# Patient Record
Sex: Male | Born: 1950 | Race: White | Hispanic: No | Marital: Single | Smoking: Never smoker
Health system: Southern US, Community
[De-identification: ages and names within clinical notes are randomized; demographics above are authoritative.]

## PROBLEM LIST (undated history)

## (undated) DIAGNOSIS — I10 Essential (primary) hypertension: Secondary | ICD-10-CM

## (undated) DIAGNOSIS — E785 Hyperlipidemia, unspecified: Secondary | ICD-10-CM

## (undated) DIAGNOSIS — E119 Type 2 diabetes mellitus without complications: Secondary | ICD-10-CM

## (undated) DIAGNOSIS — R000 Tachycardia, unspecified: Secondary | ICD-10-CM

## (undated) HISTORY — DX: Type 2 diabetes mellitus without complications: E11.9

## (undated) HISTORY — DX: Hyperlipidemia, unspecified: E78.5

## (undated) HISTORY — DX: Tachycardia, unspecified: R00.0

## (undated) HISTORY — DX: Essential (primary) hypertension: I10

---
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
## 2011-09-02 ENCOUNTER — Ambulatory Visit (INDEPENDENT_AMBULATORY_CARE_PROVIDER_SITE_OTHER): Payer: BC Managed Care – PPO

## 2011-09-02 DIAGNOSIS — Z79899 Other long term (current) drug therapy: Secondary | ICD-10-CM

## 2011-09-02 DIAGNOSIS — I10 Essential (primary) hypertension: Secondary | ICD-10-CM

## 2011-09-02 DIAGNOSIS — Z23 Encounter for immunization: Secondary | ICD-10-CM

## 2011-09-02 DIAGNOSIS — E119 Type 2 diabetes mellitus without complications: Secondary | ICD-10-CM

## 2011-09-02 DIAGNOSIS — Z139 Encounter for screening, unspecified: Secondary | ICD-10-CM

## 2011-11-09 ENCOUNTER — Other Ambulatory Visit: Payer: Self-pay | Admitting: Family Medicine

## 2011-12-08 ENCOUNTER — Ambulatory Visit (INDEPENDENT_AMBULATORY_CARE_PROVIDER_SITE_OTHER): Payer: BC Managed Care – PPO | Admitting: Family Medicine

## 2011-12-08 ENCOUNTER — Encounter: Payer: Self-pay | Admitting: Family Medicine

## 2011-12-08 VITALS — BP 138/82 | HR 123 | Temp 98.6°F | Resp 18 | Ht 68.5 in | Wt 228.0 lb

## 2011-12-08 DIAGNOSIS — L920 Granuloma annulare: Secondary | ICD-10-CM

## 2011-12-08 DIAGNOSIS — Z23 Encounter for immunization: Secondary | ICD-10-CM

## 2011-12-08 DIAGNOSIS — E119 Type 2 diabetes mellitus without complications: Secondary | ICD-10-CM

## 2011-12-08 DIAGNOSIS — E785 Hyperlipidemia, unspecified: Secondary | ICD-10-CM

## 2011-12-08 DIAGNOSIS — Z0000 Encounter for general adult medical examination without abnormal findings: Secondary | ICD-10-CM

## 2011-12-08 DIAGNOSIS — I10 Essential (primary) hypertension: Secondary | ICD-10-CM

## 2011-12-08 LAB — COMPREHENSIVE METABOLIC PANEL
Albumin: 4.4 g/dL (ref 3.5–5.2)
BUN: 18 mg/dL (ref 6–23)
CO2: 26 mEq/L (ref 19–32)
Calcium: 9 mg/dL (ref 8.4–10.5)
Chloride: 97 mEq/L (ref 96–112)
Creat: 1.06 mg/dL (ref 0.50–1.35)
Glucose, Bld: 108 mg/dL — ABNORMAL HIGH (ref 70–99)
Potassium: 4.4 mEq/L (ref 3.5–5.3)

## 2011-12-08 LAB — LIPID PANEL
Cholesterol: 163 mg/dL (ref 0–200)
HDL: 43 mg/dL (ref >39)
Triglycerides: 113 mg/dL (ref <150)

## 2011-12-08 LAB — GLUCOSE, POCT (MANUAL RESULT ENTRY): POC Glucose: 104

## 2011-12-08 MED ORDER — LISINOPRIL-HYDROCHLOROTHIAZIDE 20-25 MG PO TABS: 1.0000 | ORAL_TABLET | Freq: Every day | ORAL | Status: DC

## 2011-12-08 MED ORDER — TRIAMCINOLONE ACETONIDE 0.1 % EX CREA: TOPICAL_CREAM | Freq: Two times a day (BID) | CUTANEOUS | Status: AC

## 2011-12-08 MED ORDER — METFORMIN HCL 500 MG PO TABS: 500.0000 mg | ORAL_TABLET | Freq: Every day | ORAL | Status: DC

## 2011-12-08 MED ORDER — METOPROLOL SUCCINATE ER 100 MG PO TB24: 100.0000 mg | ORAL_TABLET | Freq: Every morning | ORAL | Status: DC

## 2011-12-08 MED ORDER — PRAVASTATIN SODIUM 40 MG PO TABS: 40.0000 mg | ORAL_TABLET | Freq: Every day | ORAL | Status: DC

## 2011-12-08 NOTE — Progress Notes (Signed)
  Subjective:    Patient ID: Chad Simon, male    DOB: 18-Dec-1950, 61 y.o.   MRN: [id number]  HPI Chad Simon is a 61 y.o. male Annual exam -  See paperwork.    HTN - home readings - 120's/60-70's at home.  Runs a little higher (150/82) at work.  No CP, no new SOB, dizziness, lightheadedness.  DM - no recent blood sugar checks.  Weight 225 to 228.  Taking metformin 500mg  qd.  Last dose yesterday. A1C 5.8% 09/02/11.  Hyperlipidemia - on pravastatin 40mg  qd. Lipids WNL 9/12.  PSA 1.97 12/12 OV.    Rash on legs for 4-5 days - itching/burning - both legs.  No tx.  Review of Systems Scannned copy of patient health survey, 13 point review of systems reviewed    Objective:   Physical Exam  Constitutional: He is oriented to person, place, and time. He appears well-developed.  HENT:  Head: Normocephalic and atraumatic.  Right Ear: External ear normal.  Left Ear: External ear normal.  Eyes: Conjunctivae and EOM are normal. Pupils are equal, round, and reactive to light.  Neck: Normal range of motion.  Cardiovascular: Normal rate, regular rhythm, normal heart sounds and intact distal pulses.   Pulmonary/Chest: Effort normal.  Abdominal: Soft. Bowel sounds are normal.  Musculoskeletal: Normal range of motion. He exhibits no edema and no tenderness.  Neurological: He is alert and oriented to person, place, and time.       Microfilament testing intact bilaterally  Skin: Skin is warm and dry.          Multiple oval - rounded patches - hyperpigmented/slight erythema with serpentine/well demarcated border, with slight faint scale.   Psychiatric: He has a normal mood and affect. His behavior is normal.   Results for orders placed in visit on 12/08/11  POCT SKIN KOH      Component Value Range   Skin KOH, POC Negative    POCT GLYCOSYLATED HEMOGLOBIN (HGB A1C)      Component Value Range   Hemoglobin A1C 5.8    GLUCOSE, POCT (MANUAL RESULT ENTRY)      Component Value Range   POC  Glucose 104         Assessment & Plan:  Chad Simon is a 61 y.o. male  CPE:  Tdap given, check lipids, cmp,  anticipatory guidance.  Scanned copy of patient health survey.  DM2: controlled.  Continue current dose of metformin, but if can lose weight by next OV in 3 months - consider holding metformin and diet control.  Hyperlipidemia: check CMP, lipids - prior controlled.  Continue same doses of medications at this time.  HTN: controlled on home readings. Component of whitecoat hypertension. Will continue medications at current doses, refills x6 months  Rash - LE's - ddx includes granuloma annulare. Less likely tinea as rapid onset, and not deep enough for necrobiosis lipoidica.   Can try topical tac 0.1% BID prn for next 2 weeks if needed.  RTC if worsening.  Recheck 3 months

## 2012-06-11 ENCOUNTER — Other Ambulatory Visit: Payer: Self-pay | Admitting: Family Medicine

## 2012-07-29 ENCOUNTER — Ambulatory Visit (INDEPENDENT_AMBULATORY_CARE_PROVIDER_SITE_OTHER): Payer: BC Managed Care – PPO | Admitting: Family Medicine

## 2012-07-29 ENCOUNTER — Encounter: Payer: Self-pay | Admitting: Family Medicine

## 2012-07-29 VITALS — BP 126/71 | HR 87 | Temp 98.8°F | Resp 18 | Ht 68.0 in | Wt 218.0 lb

## 2012-07-29 DIAGNOSIS — E785 Hyperlipidemia, unspecified: Secondary | ICD-10-CM | POA: Insufficient documentation

## 2012-07-29 DIAGNOSIS — E119 Type 2 diabetes mellitus without complications: Secondary | ICD-10-CM

## 2012-07-29 DIAGNOSIS — E782 Mixed hyperlipidemia: Secondary | ICD-10-CM

## 2012-07-29 DIAGNOSIS — I10 Essential (primary) hypertension: Secondary | ICD-10-CM | POA: Insufficient documentation

## 2012-07-29 LAB — LIPID PANEL
Cholesterol: 169 mg/dL (ref 0–200)
HDL: 46 mg/dL (ref >39)
Total CHOL/HDL Ratio: 3.7 Ratio
Triglycerides: 107 mg/dL (ref <150)
VLDL: 21 mg/dL (ref 0–40)

## 2012-07-29 LAB — COMPREHENSIVE METABOLIC PANEL
BUN: 19 mg/dL (ref 6–23)
CO2: 26 mEq/L (ref 19–32)
Calcium: 9.7 mg/dL (ref 8.4–10.5)
Creat: 0.98 mg/dL (ref 0.50–1.35)
Glucose, Bld: 87 mg/dL (ref 70–99)
Total Bilirubin: 0.6 mg/dL (ref 0.3–1.2)

## 2012-07-29 LAB — POCT GLYCOSYLATED HEMOGLOBIN (HGB A1C): Hemoglobin A1C: 5.7

## 2012-07-29 LAB — GLUCOSE, POCT (MANUAL RESULT ENTRY): POC Glucose: 96 mg/dl (ref 70–99)

## 2012-07-29 NOTE — Progress Notes (Signed)
Subjective:    Patient ID: Chad Simon, male    DOB: 1951-05-28, 61 y.o.   MRN: [id number]  HPI Chad Simon is a 61 y.o. male  Last ov 12/08/11.   HTN - home readings. Outside readings: 118-129/70-79.  DM - Taking metformin 500mg  qd. Has to take with food. Not checking home blood sugars. Weight 225 to 218 today.   Hyperlipidemia - on pravastatin 40mg  qd. No new myalgias. Tolerating meds well. Fasting today.    Results for orders placed in visit on 12/08/11  POCT SKIN KOH      Component Value Range   Skin KOH, POC Negative    POCT GLYCOSYLATED HEMOGLOBIN (HGB A1C)      Component Value Range   Hemoglobin A1C 5.8    COMPREHENSIVE METABOLIC PANEL      Component Value Range   Sodium 135  135 - 145 mEq/L   Potassium 4.4  3.5 - 5.3 mEq/L   Chloride 97  96 - 112 mEq/L   CO2 26  19 - 32 mEq/L   Glucose, Bld 108 (*) 70 - 99 mg/dL   BUN 18  6 - 23 mg/dL   Creat 4.09  8.11 - 9.14 mg/dL   Total Bilirubin 0.8  0.3 - 1.2 mg/dL   Alkaline Phosphatase 51  39 - 117 U/L   AST 18  0 - 37 U/L   ALT 22  0 - 53 U/L   Total Protein 6.9  6.0 - 8.3 g/dL   Albumin 4.4  3.5 - 5.2 g/dL   Calcium 9.0  8.4 - 78.2 mg/dL  LIPID PANEL      Component Value Range   Cholesterol 163  0 - 200 mg/dL   Triglycerides 956  <213 mg/dL   HDL 43  >08 mg/dL   Total CHOL/HDL Ratio 3.8     VLDL 23  0 - 40 mg/dL   LDL Cholesterol 97  0 - 99 mg/dL  GLUCOSE, POCT (MANUAL RESULT ENTRY)      Component Value Range   POC Glucose 104       Review of Systems  Constitutional: Negative for fatigue and unexpected weight change.  Eyes: Negative for visual disturbance.  Respiratory: Negative for cough, chest tightness and shortness of breath.   Cardiovascular: Negative for chest pain, palpitations and leg swelling.  Gastrointestinal: Negative for abdominal pain and blood in stool.  Neurological: Negative for dizziness, light-headedness and headaches.       Objective:   Physical Exam  Constitutional: He is  oriented to person, place, and time. He appears well-developed and well-nourished.  HENT:  Head: Normocephalic and atraumatic.  Eyes: Pupils are equal, round, and reactive to light.  Cardiovascular: Normal rate, regular rhythm, normal heart sounds and intact distal pulses.   Pulmonary/Chest: Effort normal and breath sounds normal.  Abdominal: Soft. There is no tenderness.  Neurological: He is alert and oriented to person, place, and time.       Microfilament testing of feet normal bilaterally.  Skin: Skin is warm, dry and intact. No rash noted.       Thickened toenails bilaterally - long nails with curve on some. R great toenail with dried blood and vertical split in nail - earlier today when trimming nail.  Declined bandage.   Psychiatric: He has a normal mood and affect. His behavior is normal.   Results for orders placed in visit on 07/29/12  GLUCOSE, POCT (MANUAL RESULT ENTRY)      Component Value Range  POC Glucose 96  70 - 99 mg/dl  POCT GLYCOSYLATED HEMOGLOBIN (HGB A1C)      Component Value Range   Hemoglobin A1C 5.7    COMPREHENSIVE METABOLIC PANEL      Component Value Range   Sodium 136  135 - 145 mEq/L   Potassium 4.1  3.5 - 5.3 mEq/L   Chloride 98  96 - 112 mEq/L   CO2 26  19 - 32 mEq/L   Glucose, Bld 87  70 - 99 mg/dL   BUN 19  6 - 23 mg/dL   Creat 1.61  0.96 - 0.45 mg/dL   Total Bilirubin 0.6  0.3 - 1.2 mg/dL   Alkaline Phosphatase 55  39 - 117 U/L   AST 21  0 - 37 U/L   ALT 24  0 - 53 U/L   Total Protein 6.9  6.0 - 8.3 g/dL   Albumin 4.6  3.5 - 5.2 g/dL   Calcium 9.7  8.4 - 40.9 mg/dL  LIPID PANEL      Component Value Range   Cholesterol 169  0 - 200 mg/dL   Triglycerides 811  <914 mg/dL   HDL 46  >78 mg/dL   Total CHOL/HDL Ratio 3.7     VLDL 21  0 - 40 mg/dL   LDL Cholesterol 295 (*) 0 - 99 mg/dL        Assessment & Plan:  Chad Simon is a 61 y.o. male 1. HTN (hypertension)  Comprehensive metabolic panel.  Controlled. Continue same doses of meds.    2. Hyperlipidemia  Comprehensive metabolic panel, Lipid panel. Refilled pravachol.   3. Diabetes  POCT glucose (manual entry), POCT glycosylated hemoglobin (Hb A1C)   Labs as above. continue same doses of meds.  Recheck in 6 months.   Discussed nail care - offered to refer to foot center. Declined curretnly. Also discussed thickened toenails and possible onychomycosis, but further tx declined.  Cracked R great toenail.. Wound care and rtc precautions discussed.  Declined bandage in office.     Patient Instructions  Your should receive a call or letter about your lab results within the next week to 10 days.  Be careful trimming your toenails. If any redness, warmth, or increase in pain around the right great toe - recheck in office as this is prone to infection.  Wash this area twice per day with soap and water. Keep it clean and covered. Return to the clinic or go to the nearest emergency room if any of your symptoms worsen or new symptoms occur.

## 2012-07-29 NOTE — Patient Instructions (Signed)
Your should receive a call or letter about your lab results within the next week to 10 days.  Be careful trimming your toenails. If any redness, warmth, or increase in pain around the right great toe - recheck in office as this is prone to infection.  Wash this area twice per day with soap and water. Keep it clean and covered. Return to the clinic or go to the nearest emergency room if any of your symptoms worsen or new symptoms occur.

## 2012-07-31 ENCOUNTER — Telehealth: Payer: Self-pay | Admitting: Physician Assistant

## 2012-07-31 MED ORDER — METOPROLOL SUCCINATE ER 100 MG PO TB24: 100.0000 mg | ORAL_TABLET | Freq: Every day | ORAL | Status: DC

## 2012-07-31 MED ORDER — LISINOPRIL-HYDROCHLOROTHIAZIDE 20-25 MG PO TABS: 1.0000 | ORAL_TABLET | Freq: Every day | ORAL | Status: DC

## 2012-07-31 MED ORDER — PRAVASTATIN SODIUM 40 MG PO TABS: 40.0000 mg | ORAL_TABLET | Freq: Every day | ORAL | Status: DC

## 2012-07-31 MED ORDER — METFORMIN HCL 500 MG PO TABS: 500.0000 mg | ORAL_TABLET | Freq: Every day | ORAL | Status: DC

## 2012-07-31 NOTE — Telephone Encounter (Signed)
Pharmacist called for refill on pt medications. Rx's done and sent to pharmacy

## 2013-02-03 ENCOUNTER — Encounter: Payer: Self-pay | Admitting: Family Medicine

## 2013-02-03 ENCOUNTER — Ambulatory Visit (INDEPENDENT_AMBULATORY_CARE_PROVIDER_SITE_OTHER): Payer: BC Managed Care – PPO | Admitting: Family Medicine

## 2013-02-03 VITALS — BP 136/68 | HR 90 | Temp 98.9°F | Resp 16 | Ht 68.75 in | Wt 223.2 lb

## 2013-02-03 DIAGNOSIS — Z79899 Other long term (current) drug therapy: Secondary | ICD-10-CM

## 2013-02-03 DIAGNOSIS — E785 Hyperlipidemia, unspecified: Secondary | ICD-10-CM

## 2013-02-03 DIAGNOSIS — I10 Essential (primary) hypertension: Secondary | ICD-10-CM

## 2013-02-03 DIAGNOSIS — B351 Tinea unguium: Secondary | ICD-10-CM

## 2013-02-03 DIAGNOSIS — E119 Type 2 diabetes mellitus without complications: Secondary | ICD-10-CM | POA: Insufficient documentation

## 2013-02-03 LAB — POCT GLYCOSYLATED HEMOGLOBIN (HGB A1C): Hemoglobin A1C: 5.8

## 2013-02-03 MED ORDER — METFORMIN HCL 500 MG PO TABS: 500.0000 mg | ORAL_TABLET | Freq: Every day | ORAL | Status: DC

## 2013-02-03 MED ORDER — LISINOPRIL-HYDROCHLOROTHIAZIDE 20-25 MG PO TABS: 1.0000 | ORAL_TABLET | Freq: Every day | ORAL | Status: DC

## 2013-02-03 MED ORDER — PRAVASTATIN SODIUM 40 MG PO TABS: 40.0000 mg | ORAL_TABLET | Freq: Every day | ORAL | Status: DC

## 2013-02-03 MED ORDER — TERBINAFINE HCL 250 MG PO TABS: 250.0000 mg | ORAL_TABLET | Freq: Every day | ORAL | Status: DC

## 2013-02-03 MED ORDER — METOPROLOL SUCCINATE ER 100 MG PO TB24: 100.0000 mg | ORAL_TABLET | Freq: Every day | ORAL | Status: DC

## 2013-02-03 NOTE — Patient Instructions (Signed)
Continue diet, exercise for weight loss. Schedule eye doctor appointment.Continue same medicines, but be careful of low blood pressures while taking the lamisil for toenails as this can increase the dose of metoprolol at times.  If your blood pressures are running low - return to clinic. Recheck in the next 3-6 months for a physical.

## 2013-02-03 NOTE — Progress Notes (Signed)
Subjective:    Patient ID: Chad Simon, male    DOB: Aug 20, 1951, 62 y.o.   MRN: [id number]  HPI Chad Simon is a 62 y.o. male  Here for follow up. Feeling well.overall. Working 11 hours per day. Busy at work. Fasting blood work.   Htn - home BP's by list - 120-130/70-80. No new side effects of meds, no lightheadedness/dizziness.   Hyperlipidemia - LDL 108 in November. No new myalgias. Tired after long work days only. No daytime somnolence.   Results for orders placed in visit on 07/29/12  COMPREHENSIVE METABOLIC PANEL      Result Value Range   Sodium 136  135 - 145 mEq/L   Potassium 4.1  3.5 - 5.3 mEq/L   Chloride 98  96 - 112 mEq/L   CO2 26  19 - 32 mEq/L   Glucose, Bld 87  70 - 99 mg/dL   BUN 19  6 - 23 mg/dL   Creat 4.09  8.11 - 9.14 mg/dL   Total Bilirubin 0.6  0.3 - 1.2 mg/dL   Alkaline Phosphatase 55  39 - 117 U/L   AST 21  0 - 37 U/L   ALT 24  0 - 53 U/L   Total Protein 6.9  6.0 - 8.3 g/dL   Albumin 4.6  3.5 - 5.2 g/dL   Calcium 9.7  8.4 - 78.2 mg/dL  LIPID PANEL      Result Value Range   Cholesterol 169  0 - 200 mg/dL   Triglycerides 956  <213 mg/dL   HDL 46  >08 mg/dL   Total CHOL/HDL Ratio 3.7     VLDL 21  0 - 40 mg/dL   LDL Cholesterol 657 (*) 0 - 99 mg/dL  GLUCOSE, POCT (MANUAL RESULT ENTRY)      Result Value Range   POC Glucose 96  70 - 99 mg/dl  POCT GLYCOSYLATED HEMOGLOBIN (HGB A1C)      Result Value Range   Hemoglobin A1C 5.7     DM2 - last A1c 5.7 in 07/2012. Continued metformin 500mg  qd. Weight 218 to 223 since last ov. Not checking blood sugars. Started walking last 6 weeks. Planning on going hiking in future in future. Fast food  3-4 times per week. Last dentist visit 3 months ago. No recent optho appt.   Review of Systems  Constitutional: Negative for fatigue and unexpected weight change.  Eyes: Negative for visual disturbance.  Respiratory: Negative for cough, chest tightness and shortness of breath.   Cardiovascular: Negative for chest  pain, palpitations and leg swelling.  Gastrointestinal: Negative for abdominal pain and blood in stool.  Musculoskeletal: Negative for arthralgias.  Skin:       Still with thickened toenails - no pain.   Neurological: Negative for dizziness, light-headedness and headaches.       Objective:   Physical Exam  Vitals reviewed. Constitutional: He is oriented to person, place, and time. He appears well-developed and well-nourished.  HENT:  Head: Normocephalic and atraumatic.  Eyes: Pupils are equal, round, and reactive to light.  Cardiovascular: Normal rate, regular rhythm, normal heart sounds and intact distal pulses.   Pulmonary/Chest: Effort normal and breath sounds normal.  Abdominal: Soft. There is no tenderness.  Neurological: He is alert and oriented to person, place, and time.  Microfilament testing of feet normal bilaterally.  Skin: Skin is warm, dry and intact. No rash noted.     Psychiatric: He has a normal mood and affect. His behavior is normal.  Results for orders placed in visit on 02/03/13  POCT GLYCOSYLATED HEMOGLOBIN (HGB A1C)      Result Value Range   Hemoglobin A1C 5.8    GLUCOSE, POCT (MANUAL RESULT ENTRY)      Result Value Range   POC Glucose 128 (*) 70 - 99 mg/dl        Assessment & Plan:  [name] is a 62 y.o. male DM2 (diabetes mellitus, type 2) - controlled.  Plan: POCT glycosylated hemoglobin (Hb A1C), POCT glucose (manual entry).  Cut back on fast food, continue exercise.  Schedule optho visit. Continue metformin 500mg  qd.   HTN (hypertension) - Plan: Comprehensive metabolic panel.  Home numbers stable. Continue zestoretic and toprol at same dose. Cautioned on lower BP's on lamisil.   Other and unspecified hyperlipidemia - Plan: Comprehensive metabolic panel, Lipid panel, continue pravachol at same dose.   Onychomycosis- discussed importance of nail care with DM2. lamisil prescribed - CMP in 6 weeks, then can refill med again.   High risk  medication use - Plan: Comprehensive metabolic panel now, then lab only visit Comprehensive metabolic panel in 6 weeks for lamisil.    Meds ordered this encounter  Medications  . pravastatin (PRAVACHOL) 40 MG tablet    Sig: Take 1 tablet (40 mg total) by mouth daily.    Dispense:  90 tablet    Refill:  1    Order Specific Question:  Supervising Provider    Answer:  DOOLITTLE, ROBERT P [[id number]]  . metoprolol succinate (TOPROL-XL) 100 MG 24 hr tablet    Sig: Take 1 tablet (100 mg total) by mouth daily. Take with or immediately following a meal.    Dispense:  90 tablet    Refill:  1    Order Specific Question:  Supervising Provider    Answer:  DOOLITTLE, ROBERT P [[id number]]  . lisinopril-hydrochlorothiazide (PRINZIDE,ZESTORETIC) 20-25 MG per tablet    Sig: Take 1 tablet by mouth daily.    Dispense:  90 tablet    Refill:  1    Order Specific Question:  Supervising Provider    Answer:  DOOLITTLE, ROBERT P [[id number]]  . terbinafine (LAMISIL) 250 MG tablet    Sig: Take 1 tablet (250 mg total) by mouth daily.    Dispense:  30 tablet    Refill:  1  . metFORMIN (GLUCOPHAGE) 500 MG tablet    Sig: Take 1 tablet (500 mg total) by mouth daily with breakfast.    Dispense:  90 tablet    Refill:  1    Order Specific Question:  Supervising Provider    Answer:  Tonye Pearson [[id number]]    Patient Instructions  Continue diet, exercise for weight loss. Schedule eye doctor appointment.Continue same medicines, but be careful of low blood pressures while taking the lamisil for toenails as this can increase the dose of metoprolol at times.  If your blood pressures are running low - return to clinic. Recheck in the next 3-6 months for a physical.

## 2013-02-04 LAB — COMPREHENSIVE METABOLIC PANEL
AST: 18 U/L (ref 0–37)
Albumin: 4.4 g/dL (ref 3.5–5.2)
Alkaline Phosphatase: 56 U/L (ref 39–117)
BUN: 17 mg/dL (ref 6–23)
Potassium: 4.7 mEq/L (ref 3.5–5.3)
Sodium: 134 mEq/L — ABNORMAL LOW (ref 135–145)
Total Protein: 7.1 g/dL (ref 6.0–8.3)

## 2013-02-04 LAB — LIPID PANEL
LDL Cholesterol: 77 mg/dL (ref 0–99)
VLDL: 13 mg/dL (ref 0–40)

## 2013-02-06 ENCOUNTER — Other Ambulatory Visit: Payer: Self-pay | Admitting: Physician Assistant

## 2013-03-19 ENCOUNTER — Other Ambulatory Visit (INDEPENDENT_AMBULATORY_CARE_PROVIDER_SITE_OTHER): Payer: BC Managed Care – PPO | Admitting: Family Medicine

## 2013-03-19 DIAGNOSIS — I10 Essential (primary) hypertension: Secondary | ICD-10-CM

## 2013-03-19 DIAGNOSIS — Z79899 Other long term (current) drug therapy: Secondary | ICD-10-CM

## 2013-03-19 DIAGNOSIS — E785 Hyperlipidemia, unspecified: Secondary | ICD-10-CM

## 2013-03-19 LAB — COMPREHENSIVE METABOLIC PANEL
AST: 20 U/L (ref 0–37)
BUN: 17 mg/dL (ref 6–23)
Calcium: 9.8 mg/dL (ref 8.4–10.5)
Chloride: 97 mEq/L (ref 96–112)
Creat: 1.09 mg/dL (ref 0.50–1.35)

## 2013-03-19 NOTE — Progress Notes (Signed)
Patient here for labs only. 

## 2013-03-20 ENCOUNTER — Other Ambulatory Visit: Payer: Self-pay | Admitting: Family Medicine

## 2013-03-20 DIAGNOSIS — B351 Tinea unguium: Secondary | ICD-10-CM

## 2013-03-20 DIAGNOSIS — Z79899 Other long term (current) drug therapy: Secondary | ICD-10-CM

## 2013-03-20 MED ORDER — TERBINAFINE HCL 250 MG PO TABS: 250.0000 mg | ORAL_TABLET | Freq: Every day | ORAL | Status: DC

## 2013-04-11 ENCOUNTER — Encounter: Payer: Self-pay | Admitting: Radiology

## 2013-07-09 ENCOUNTER — Other Ambulatory Visit: Payer: Self-pay | Admitting: Physician Assistant

## 2013-10-03 ENCOUNTER — Other Ambulatory Visit: Payer: Self-pay | Admitting: Family Medicine

## 2013-10-28 ENCOUNTER — Other Ambulatory Visit: Payer: Self-pay | Admitting: Family Medicine

## 2013-11-09 ENCOUNTER — Other Ambulatory Visit: Payer: Self-pay | Admitting: Family Medicine

## 2014-01-29 ENCOUNTER — Ambulatory Visit (INDEPENDENT_AMBULATORY_CARE_PROVIDER_SITE_OTHER): Payer: BC Managed Care – PPO | Admitting: Family Medicine

## 2014-01-29 VITALS — BP 128/72 | HR 96 | Temp 98.4°F | Resp 18 | Ht 70.0 in | Wt 229.0 lb

## 2014-01-29 DIAGNOSIS — E785 Hyperlipidemia, unspecified: Secondary | ICD-10-CM

## 2014-01-29 DIAGNOSIS — H9201 Otalgia, right ear: Secondary | ICD-10-CM

## 2014-01-29 DIAGNOSIS — I10 Essential (primary) hypertension: Secondary | ICD-10-CM

## 2014-01-29 DIAGNOSIS — H60399 Other infective otitis externa, unspecified ear: Secondary | ICD-10-CM

## 2014-01-29 DIAGNOSIS — H6120 Impacted cerumen, unspecified ear: Secondary | ICD-10-CM

## 2014-01-29 DIAGNOSIS — E119 Type 2 diabetes mellitus without complications: Secondary | ICD-10-CM

## 2014-01-29 DIAGNOSIS — C96A Histiocytic sarcoma: Secondary | ICD-10-CM

## 2014-01-29 LAB — LIPID PANEL
Cholesterol: 185 mg/dL (ref 0–200)
HDL: 54 mg/dL (ref >39)
LDL CALC: 109 mg/dL — AB (ref 0–99)
Total CHOL/HDL Ratio: 3.4 Ratio
Triglycerides: 109 mg/dL (ref <150)
VLDL: 22 mg/dL (ref 0–40)

## 2014-01-29 LAB — POCT GLYCOSYLATED HEMOGLOBIN (HGB A1C): HEMOGLOBIN A1C: 5.8

## 2014-01-29 LAB — COMPREHENSIVE METABOLIC PANEL
ALBUMIN: 4.2 g/dL (ref 3.5–5.2)
ALT: 23 U/L (ref 0–53)
AST: 19 U/L (ref 0–37)
Alkaline Phosphatase: 55 U/L (ref 39–117)
BUN: 14 mg/dL (ref 6–23)
CO2: 25 mEq/L (ref 19–32)
CREATININE: 0.97 mg/dL (ref 0.50–1.35)
Calcium: 9.8 mg/dL (ref 8.4–10.5)
Chloride: 96 mEq/L (ref 96–112)
Glucose, Bld: 115 mg/dL — ABNORMAL HIGH (ref 70–99)
POTASSIUM: 4.6 meq/L (ref 3.5–5.3)
Sodium: 132 mEq/L — ABNORMAL LOW (ref 135–145)
Total Bilirubin: 0.6 mg/dL (ref 0.2–1.2)
Total Protein: 7.1 g/dL (ref 6.0–8.3)

## 2014-01-29 LAB — GLUCOSE, POCT (MANUAL RESULT ENTRY): POC GLUCOSE: 109 mg/dL — AB (ref 70–99)

## 2014-01-29 MED ORDER — METOPROLOL SUCCINATE ER 100 MG PO TB24: 100.0000 mg | ORAL_TABLET | Freq: Every day | ORAL | Status: DC

## 2014-01-29 MED ORDER — OFLOXACIN 0.3 % OT SOLN: 10.0000 [drp] | Freq: Every day | OTIC | Status: AC

## 2014-01-29 MED ORDER — PRAVASTATIN SODIUM 40 MG PO TABS: ORAL_TABLET | ORAL | Status: DC

## 2014-01-29 MED ORDER — LISINOPRIL-HYDROCHLOROTHIAZIDE 20-25 MG PO TABS: 1.0000 | ORAL_TABLET | Freq: Every day | ORAL | Status: DC

## 2014-01-29 MED ORDER — METFORMIN HCL 500 MG PO TABS: 500.0000 mg | ORAL_TABLET | Freq: Every day | ORAL | Status: DC

## 2014-01-29 MED ORDER — OFLOXACIN 0.3 % OT SOLN: 10.0000 [drp] | Freq: Every day | OTIC | Status: DC

## 2014-01-29 MED ORDER — HYDROCODONE-ACETAMINOPHEN 5-325 MG PO TABS: 1.0000 | ORAL_TABLET | Freq: Four times a day (QID) | ORAL | Status: AC | PRN

## 2014-01-29 NOTE — Patient Instructions (Addendum)
Pick up drops and return so I can place the ear wick with drops. Once infected/irritated ear improves in next week or two, can discuss treatments to remove wax. Switch to over the ear hearing protection if possible, as ear plugs may be contributing to wax buildup.    If needed, I prescribed a stronger pain medicine - do not drive or operate heavy machinery while taking the hydrocodone.   We refilled your other medicines today. We will call you for appointment.   You should receive a call or letter about your lab results within the next week to 10 days.   Return to the clinic or go to the nearest emergency room if any of your symptoms worsen or new symptoms occur.  Otitis Externa Otitis externa is a bacterial or fungal infection of the outer ear canal. This is the area from the eardrum to the outside of the ear. Otitis externa is sometimes called "swimmer's ear." CAUSES  Possible causes of infection include:  Swimming in dirty water.  Moisture remaining in the ear after swimming or bathing.  Mild injury (trauma) to the ear.  Objects stuck in the ear (foreign body).  Cuts or scrapes (abrasions) on the outside of the ear. SYMPTOMS  The first symptom of infection is often itching in the ear canal. Later signs and symptoms may include swelling and redness of the ear canal, ear pain, and yellowish-white fluid (pus) coming from the ear. The ear pain may be worse when pulling on the earlobe. DIAGNOSIS  Your caregiver will perform a physical exam. A sample of fluid may be taken from the ear and examined for bacteria or fungi. TREATMENT  Antibiotic ear drops are often given for 10 to 14 days. Treatment may also include pain medicine or corticosteroids to reduce itching and swelling. PREVENTION   Keep your ear dry. Use the corner of a towel to absorb water out of the ear canal after swimming or bathing.  Avoid scratching or putting objects inside your ear. This can damage the ear canal or remove  the protective wax that lines the canal. This makes it easier for bacteria and fungi to grow.  Avoid swimming in lakes, polluted water, or poorly chlorinated pools.  You may use ear drops made of rubbing alcohol and vinegar after swimming. Combine equal parts of white vinegar and alcohol in a bottle. Put 3 or 4 drops into each ear after swimming. HOME CARE INSTRUCTIONS   Apply antibiotic ear drops to the ear canal as prescribed by your caregiver.  Only take over-the-counter or prescription medicines for pain, discomfort, or fever as directed by your caregiver.  If you have diabetes, follow any additional treatment instructions from your caregiver.  Keep all follow-up appointments as directed by your caregiver. SEEK MEDICAL CARE IF:   You have a fever.  Your ear is still red, swollen, painful, or draining pus after 3 days.  Your redness, swelling, or pain gets worse.  You have a severe headache.  You have redness, swelling, pain, or tenderness in the area behind your ear. MAKE SURE YOU:   Understand these instructions.  Will watch your condition.  Will get help right away if you are not doing well or get worse. Document Released: 08/21/2005 Document Revised: 11/13/2011 Document Reviewed: 09/07/2011 [facility] Patient Information 2014 [place].

## 2014-01-29 NOTE — Progress Notes (Addendum)
Subjective:    Patient ID: Chad Simon, male    DOB: [birth year], 63 y.o.   MRN: [id number] This chart was scribed for Merri Ray, MD by Vernell Barrier, Medical Scribe. The patient was seen in room 1. This patient's care was started at 9:56 AM.  Otalgia  Pertinent negatives include no abdominal pain, coughing, ear discharge or headaches.   HPI Comments: Chad Simon is a 63 y.o. male who presents to the [facility] complaining of right otalgia; onset 2 day ago. Able to hear without difficulty. Left ear normal. Tried Mur[place] ear wash in the right and states this made pain worse. Says he has not slept in approximately 36 hours due to pain. Denies fever, or ear discharge.   At the end of visit, also states he's here for medication refill today.  Last seen by me June 2014. States he was working and did not have time to schedule an appointment between then and now.   1. HTN: Readings around 125/75 at home. Taking Lisinopril 20-25 mg. Denies any associated symptoms.   2. Hyperglycemia: With hx of DM2. A1C of 5.8 and glucose of 96 on 02/03/2013. Continued on Metformin 500 mg per day.  3. Hyperlipidemia: On Pravachol 40 mg each day. Last lipid panel 02/2013. Controlled with total cholesterol 141 and LDL 77. CMP July 2014. LFT stable.  Patient Active Problem List   Diagnosis Date Noted  . DM2 (diabetes mellitus, type 2) 02/03/2013  . HTN (hypertension) 07/29/2012  . Hyperlipidemia 07/29/2012   Past Medical History  Diagnosis Date  . Hypertension   . Tachycardia   . Hyperlipidemia    Past Surgical History  Procedure Laterality Date  . Appendectomy  1971   No Known Allergies Prior to Admission medications   Medication Sig Start Date End Date Taking? Authorizing Provider  aspirin 81 MG tablet Take 81 mg by mouth daily.   Yes Historical Provider, MD  lisinopril-hydrochlorothiazide (PRINZIDE,ZESTORETIC) 20-25 MG per tablet TAKE 1 TABLET BY MOUTH DAILY. 02/06/13   Yes Wendie Agreste, MD  metFORMIN (GLUCOPHAGE) 500 MG tablet TAKE 1 TABLET BY MOUTH DAILY WITH BREAKFAST. 02/06/13  Yes Wendie Agreste, MD  metoprolol succinate (TOPROL-XL) 100 MG 24 hr tablet TAKE 1 TABLET BY MOUTH DAILY. TAKE WITH OR IMMEDIATELY FOLLOWING A MEAL. 02/06/13  Yes Wendie Agreste, MD  pravastatin (PRAVACHOL) 40 MG tablet TAKE 1 TABLET BY MOUTH DAILY 02/06/13  Yes Wendie Agreste, MD  lisinopril-hydrochlorothiazide (PRINZIDE,ZESTORETIC) 20-25 MG per tablet Take 1 tablet by mouth daily. PATIENT NEEDS OFFICE VISIT FOR ADDITIONAL REFILLS 10/03/13   Wendie Agreste, MD  metFORMIN (GLUCOPHAGE) 500 MG tablet Take 1 tablet (500 mg total) by mouth daily. PATIENT NEEDS OFFICE VISIT FOR ADDITIONAL REFILLS 10/03/13   Wendie Agreste, MD  metoprolol succinate (TOPROL-XL) 100 MG 24 hr tablet Take 1 tablet (100 mg total) by mouth daily. Take with or immediately following a meal. 02/03/13   Wendie Agreste, MD  metoprolol succinate (TOPROL-XL) 100 MG 24 hr tablet Take 1 tablet (100 mg total) by mouth daily. PATIENT NEEDS OFFICE VISIT FOR ADDITIONAL REFILLS 07/09/13   Wendie Agreste, MD  pravastatin (PRAVACHOL) 40 MG tablet Take 1 tablet (40 mg total) by mouth daily. 02/03/13   Wendie Agreste, MD  terbinafine (LAMISIL) 250 MG tablet Take 1 tablet (250 mg total) by mouth daily. 03/20/13   Wendie Agreste, MD   History   Social History  . Marital Status: Single  Spouse Name: N/A    Number of Children: N/A  . Years of Education: N/A   Occupational History  . Not on file.   Social History Main Topics  . Smoking status: Never Smoker   . Smokeless tobacco: Not on file  . Alcohol Use: No  . Drug Use: No  . Sexual Activity: Not on file   Other Topics Concern  . Not on file   Social History Narrative  . No narrative on file   Review of Systems  Constitutional: Negative for fever, fatigue and unexpected weight change.  HENT: Positive for ear pain. Negative for ear discharge.   Eyes:  Negative for visual disturbance.  Respiratory: Negative for cough, chest tightness and shortness of breath.   Cardiovascular: Negative for chest pain, palpitations and leg swelling.  Gastrointestinal: Negative for abdominal pain and blood in stool.  Neurological: Negative for dizziness, light-headedness and headaches.   Objective:  Physical Exam  Vitals reviewed. Constitutional: He is oriented to person, place, and time. He appears well-developed and well-nourished.  HENT:  Head: Normocephalic and atraumatic.  Right Ear: Hearing and ear canal normal. There is swelling. No tenderness.  Left Ear: Hearing, external ear and ear canal normal.  Nose: No rhinorrhea.  Mouth/Throat: Oropharynx is clear and moist and mucous membranes are normal. No oropharyngeal exudate or posterior oropharyngeal erythema.  Left ear has a narrow canal with cerumen noted. Unable to visualize TM. Right external ear non-tender. Canal slightly erythematous and edematous with cerumen distally. Lighter color cerumen versus discharge. Unable to visualize right TM.  Eyes: Conjunctivae and EOM are normal. Pupils are equal, round, and reactive to light.  Neck: Neck supple. No JVD present. Carotid bruit is not present.  No lymphadenopathy in the neck or around right ear.  Cardiovascular: Normal rate, regular rhythm, normal heart sounds and intact distal pulses.   No murmur heard. Pulmonary/Chest: Effort normal and breath sounds normal. He has no wheezes. He has no rhonchi. He has no rales.  Abdominal: Soft. There is no tenderness.  Musculoskeletal: He exhibits no edema.  Lymphadenopathy:    He has no cervical adenopathy.  Neurological: He is alert and oriented to person, place, and time.  Skin: Skin is warm and dry. No rash noted.  Psychiatric: He has a normal mood and affect. His behavior is normal.   Filed Vitals:   01/29/14 0848  BP: 128/72  Pulse: 96  Temp: 98.4 F (36.9 C)  TempSrc: Oral  Resp: 18  Height: 5'  10" (1.778 m)  Weight: 229 lb (103.874 kg)  SpO2: 96%   Results for orders placed in visit on 01/29/14  GLUCOSE, POCT (MANUAL RESULT ENTRY)      Result Value Ref Range   POC Glucose 109 (*) 70 - 99 mg/dl  POCT GLYCOSYLATED HEMOGLOBIN (HGB A1C)      Result Value Ref Range   Hemoglobin A1C 5.8       Assessment & Plan:   Chad Simon is a 63 y.o. male Otitis, externa, infective Ear pain, right - Plan: ofloxacin (FLOXIN) 0.3 % otic solution, HYDROcodone-acetaminophen (NORCO/VICODIN) 5-325 MG per tablet  - sent for floxin otic Rx, retuned with rx, placed wick and 10 gtts as initial dose. rtc precautions if not improving in next few days or after wick falls out on own and not improving.   Cerumen impaction - avoiding lavage with infection today.  Advised over the ear protection and try to avoid earplugs. In next week or two can look  at lavage if needed.   Diabetes type 2, controlled - Plan: POCT glucose (manual entry), POCT glycosylated hemoglobin (Hb A1C), Comprehensive metabolic panel, Lipid panel, metFORMIN (GLUCOPHAGE) 500 MG tablet - controlled, refilled meds.   Hypertension - Plan: POCT glucose (manual entry), POCT glycosylated hemoglobin (Hb A1C), Comprehensive metabolic panel, Lipid panel, metoprolol succinate (TOPROL-XL) 100 MG 24 hr tablet, lisinopril-hydrochlorothiazide (PRINZIDE,ZESTORETIC) 20-25 MG per tablet - controlled.  Labs pending. meds refilled.   Hyperlipidemia - Plan: POCT glucose (manual entry), POCT glycosylated hemoglobin (Hb A1C), Comprehensive metabolic panel, Lipid panel, pravastatin (PRAVACHOL) 40 MG tablet - cmp lipids pending.  Refilled pravachol.     Meds ordered this encounter  Medications  . ofloxacin (FLOXIN) 0.3 % otic solution    Sig: Place 10 drops into the right ear daily.    Dispense:  5 mL    Refill:  0  . HYDROcodone-acetaminophen (NORCO/VICODIN) 5-325 MG per tablet    Sig: Take 1 tablet by mouth every 6 (six) hours as needed for moderate  pain.    Dispense:  15 tablet    Refill:  0  . pravastatin (PRAVACHOL) 40 MG tablet    Sig: TAKE 1 TABLET BY MOUTH DAILY    Dispense:  90 tablet    Refill:  1  . metoprolol succinate (TOPROL-XL) 100 MG 24 hr tablet    Sig: Take 1 tablet (100 mg total) by mouth daily.    Dispense:  90 tablet    Refill:  1  . metFORMIN (GLUCOPHAGE) 500 MG tablet    Sig: Take 1 tablet (500 mg total) by mouth daily.    Dispense:  90 tablet    Refill:  1  . lisinopril-hydrochlorothiazide (PRINZIDE,ZESTORETIC) 20-25 MG per tablet    Sig: Take 1 tablet by mouth daily.    Dispense:  90 tablet    Refill:  1   Patient Instructions  Pick up drops and return so I can place the ear wick with drops. Once infected/irritated ear improves in next week or two, can discuss treatments to remove wax. Switch to over the ear hearing protection if possible, as ear plugs may be contributing to wax buildup.    If needed, I prescribed a stronger pain medicine - do not drive or operate heavy machinery while taking the hydrocodone.   We refilled your other medicines today. We will call you for appointment.   You should receive a call or letter about your lab results within the next week to 10 days.   Return to the clinic or go to the nearest emergency room if any of your symptoms worsen or new symptoms occur.  Otitis Externa Otitis externa is a bacterial or fungal infection of the outer ear canal. This is the area from the eardrum to the outside of the ear. Otitis externa is sometimes called "swimmer's ear." CAUSES  Possible causes of infection include:  Swimming in dirty water.  Moisture remaining in the ear after swimming or bathing.  Mild injury (trauma) to the ear.  Objects stuck in the ear (foreign body).  Cuts or scrapes (abrasions) on the outside of the ear. SYMPTOMS  The first symptom of infection is often itching in the ear canal. Later signs and symptoms may include swelling and redness of the ear canal, ear  pain, and yellowish-white fluid (pus) coming from the ear. The ear pain may be worse when pulling on the earlobe. DIAGNOSIS  Your caregiver will perform a physical exam. A sample of fluid may be taken  from the ear and examined for bacteria or fungi. TREATMENT  Antibiotic ear drops are often given for 10 to 14 days. Treatment may also include pain medicine or corticosteroids to reduce itching and swelling. PREVENTION   Keep your ear dry. Use the corner of a towel to absorb water out of the ear canal after swimming or bathing.  Avoid scratching or putting objects inside your ear. This can damage the ear canal or remove the protective wax that lines the canal. This makes it easier for bacteria and fungi to grow.  Avoid swimming in lakes, polluted water, or poorly chlorinated pools.  You may use ear drops made of rubbing alcohol and vinegar after swimming. Combine equal parts of white vinegar and alcohol in a bottle. Put 3 or 4 drops into each ear after swimming. HOME CARE INSTRUCTIONS   Apply antibiotic ear drops to the ear canal as prescribed by your caregiver.  Only take over-the-counter or prescription medicines for pain, discomfort, or fever as directed by your caregiver.  If you have diabetes, follow any additional treatment instructions from your caregiver.  Keep all follow-up appointments as directed by your caregiver. SEEK MEDICAL CARE IF:   You have a fever.  Your ear is still red, swollen, painful, or draining pus after 3 days.  Your redness, swelling, or pain gets worse.  You have a severe headache.  You have redness, swelling, pain, or tenderness in the area behind your ear. MAKE SURE YOU:   Understand these instructions.  Will watch your condition.  Will get help right away if you are not doing well or get worse. Document Released: 08/21/2005 Document Revised: 11/13/2011 Document Reviewed: 09/07/2011 [facility] Patient Information 2014 [place].      I  personally performed the services described in this documentation, which was scribed in my presence. The recorded information has been reviewed and considered, and addended by me as needed.

## 2014-04-03 ENCOUNTER — Ambulatory Visit (INDEPENDENT_AMBULATORY_CARE_PROVIDER_SITE_OTHER): Payer: BC Managed Care – PPO | Admitting: Family Medicine

## 2014-04-03 ENCOUNTER — Ambulatory Visit (INDEPENDENT_AMBULATORY_CARE_PROVIDER_SITE_OTHER): Payer: BC Managed Care – PPO

## 2014-04-03 VITALS — BP 160/90 | HR 100 | Temp 98.1°F | Resp 18 | Ht 68.0 in | Wt 230.0 lb

## 2014-04-03 DIAGNOSIS — H6123 Impacted cerumen, bilateral: Secondary | ICD-10-CM

## 2014-04-03 DIAGNOSIS — M25579 Pain in unspecified ankle and joints of unspecified foot: Secondary | ICD-10-CM

## 2014-04-03 DIAGNOSIS — E871 Hypo-osmolality and hyponatremia: Secondary | ICD-10-CM

## 2014-04-03 DIAGNOSIS — B351 Tinea unguium: Secondary | ICD-10-CM

## 2014-04-03 DIAGNOSIS — M2550 Pain in unspecified joint: Secondary | ICD-10-CM

## 2014-04-03 DIAGNOSIS — H6120 Impacted cerumen, unspecified ear: Secondary | ICD-10-CM

## 2014-04-03 DIAGNOSIS — M25571 Pain in right ankle and joints of right foot: Secondary | ICD-10-CM

## 2014-04-03 MED ORDER — COLCHICINE 0.6 MG PO TABS: 0.6000 mg | ORAL_TABLET | Freq: Two times a day (BID) | ORAL | Status: DC

## 2014-04-03 NOTE — Patient Instructions (Signed)
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
## 2014-04-04 LAB — BASIC METABOLIC PANEL
BUN: 14 mg/dL (ref 6–23)
CO2: 26 mEq/L (ref 19–32)
Chloride: 100 mEq/L (ref 96–112)
Creat: 1.03 mg/dL (ref 0.50–1.35)
Glucose, Bld: 104 mg/dL — ABNORMAL HIGH (ref 70–99)
Sodium: 137 mEq/L (ref 135–145)

## 2014-04-04 LAB — BASIC METABOLIC PANEL WITH GFR
Calcium: 9.2 mg/dL (ref 8.4–10.5)
Potassium: 4.6 meq/L (ref 3.5–5.3)

## 2014-04-04 LAB — URIC ACID: Uric Acid, Serum: 5.5 mg/dL (ref 4.0–7.8)

## 2014-04-10 ENCOUNTER — Telehealth: Payer: Self-pay | Admitting: *Deleted

## 2014-04-10 NOTE — Telephone Encounter (Signed)
Pt came by office today to pick up copy of labs, I informed him of results at this time.,

## 2014-10-26 ENCOUNTER — Ambulatory Visit (INDEPENDENT_AMBULATORY_CARE_PROVIDER_SITE_OTHER): Payer: BLUE CROSS/BLUE SHIELD | Admitting: Physician Assistant

## 2014-10-26 ENCOUNTER — Encounter: Payer: Self-pay | Admitting: Physician Assistant

## 2014-10-26 VITALS — BP 152/88 | HR 103 | Temp 98.0°F | Resp 20 | Ht 69.0 in | Wt 226.0 lb

## 2014-10-26 DIAGNOSIS — R1084 Generalized abdominal pain: Secondary | ICD-10-CM

## 2014-10-26 DIAGNOSIS — K529 Noninfective gastroenteritis and colitis, unspecified: Secondary | ICD-10-CM

## 2014-10-26 LAB — COMPREHENSIVE METABOLIC PANEL
ALT: 34 U/L (ref 0–53)
AST: 24 U/L (ref 0–37)
Albumin: 4 g/dL (ref 3.5–5.2)
Alkaline Phosphatase: 59 U/L (ref 39–117)
BUN: 9 mg/dL (ref 6–23)
CALCIUM: 8.9 mg/dL (ref 8.4–10.5)
CHLORIDE: 101 meq/L (ref 96–112)
CO2: 25 mEq/L (ref 19–32)
Creat: 0.91 mg/dL (ref 0.50–1.35)
GLUCOSE: 123 mg/dL — AB (ref 70–99)
Potassium: 4.1 mEq/L (ref 3.5–5.3)
Sodium: 135 mEq/L (ref 135–145)
TOTAL PROTEIN: 7 g/dL (ref 6.0–8.3)
Total Bilirubin: 0.4 mg/dL (ref 0.2–1.2)

## 2014-10-26 LAB — AMYLASE: AMYLASE: 28 U/L (ref 0–105)

## 2014-10-26 MED ORDER — ONDANSETRON HCL 4 MG PO TABS: 4.0000 mg | ORAL_TABLET | Freq: Three times a day (TID) | ORAL | Status: AC | PRN

## 2014-10-26 MED ORDER — LOPERAMIDE HCL 2 MG PO TABS: 2.0000 mg | ORAL_TABLET | Freq: Four times a day (QID) | ORAL | Status: AC | PRN

## 2014-10-26 NOTE — Progress Notes (Signed)
   Subjective:    Patient ID: Chad Simon, male    DOB: [birth year], 64 y.o.   MRN: [id number]  HPI Patient presents with 4 days of nausea and diarrhea following meals. Nausea last for approx. 45 min following meals and goes away on its own. Stools are loose to watery quality and had 2 episodes yesterday. No blood or mucus in stool. Has dry heaves, but no vomiting. Abdomen hurts all over. Denies intermittent constipation, gas, fever, chills, urinary sx, headache, and dizzy. Appetite is the same and not limited to type of foods he can eat. Sx not better or worse with type of food. Has eaten at restaurant, but had vegetable plate on Thursday. Denies new/raw foods, change in medication, or recent antibx use. Had co-worker that had stomach virus recently. Tried to eat yogurt, but did not provide relief. Denies h/o IBS, celiac, or acid reflux. H/o appendectomy, but no other abdominal surgeries.   BP elevated this morning. Says he has not taken BP medication bc he didn't eat breakfast yet.  Review of Systems  Constitutional: Negative for fever, chills, diaphoresis and appetite change.  Respiratory: Negative for shortness of breath.   Cardiovascular: Negative for chest pain.  Gastrointestinal: Positive for nausea, abdominal pain and diarrhea. Negative for vomiting, constipation, blood in stool and abdominal distention.  Genitourinary: Negative.   Neurological: Negative for dizziness, light-headedness and headaches.       Objective:   Physical Exam  Constitutional: He is oriented to person, place, and time. He appears well-developed and well-nourished. No distress.  Blood pressure 152/88, pulse 103, temperature 98 F (36.7 C), temperature source Oral, resp. rate 20, height 5\' 9"  (1.753 m), weight 226 lb (102.513 kg), SpO2 96 %.  HENT:  Head: Normocephalic and atraumatic.  Right Ear: External ear normal.  Left Ear: External ear normal.  Eyes: Conjunctivae are normal. Right eye exhibits no  discharge. Left eye exhibits no discharge. No scleral icterus.  Neck: Neck supple. No thyromegaly present.  Cardiovascular: Normal rate, regular rhythm and normal heart sounds.  Exam reveals no gallop and no friction rub.   No murmur heard. Pulmonary/Chest: Effort normal and breath sounds normal. No respiratory distress. He has no wheezes. He has no rales.  Abdominal: Soft. Normal appearance and bowel sounds are normal. He exhibits no distension, no pulsatile liver, no abdominal bruit, no ascites and no mass. There is no tenderness. There is no rebound, no guarding, no tenderness at McBurney's point and negative Murphy's sign. A hernia (umbilical) is present.  Lymphadenopathy:    He has no cervical adenopathy.  Neurological: He is alert and oriented to person, place, and time.  Skin: Skin is warm and dry. No rash noted. He is not diaphoretic. No erythema. No pallor.        Assessment & Plan:  1. Gastroenteritis - loperamide (IMODIUM A-D) 2 MG tablet; Take 1 tablet (2 mg total) by mouth 4 (four) times daily as needed for diarrhea or loose stools.  Dispense: 30 tablet; Refill: 0 - ondansetron (ZOFRAN) 4 MG tablet; Take 1 tablet (4 mg total) by mouth every 8 (eight) hours as needed for nausea or vomiting.  Dispense: 20 tablet; Refill: 0 - Note to return to work 10/27/14 given. Patient request being out for 1 week and I declined.   2. Generalized abdominal pain - Comprehensive metabolic panel - Amylase   Alveta Heimlich PA-C  [facility] 10/26/2014 9:13 AM

## 2014-12-26 ENCOUNTER — Other Ambulatory Visit: Payer: Self-pay | Admitting: Family Medicine

## 2015-01-25 ENCOUNTER — Other Ambulatory Visit: Payer: Self-pay

## 2015-01-25 MED ORDER — METOPROLOL SUCCINATE ER 100 MG PO TB24: 100.0000 mg | ORAL_TABLET | Freq: Every day | ORAL | Status: DC

## 2015-01-25 MED ORDER — LISINOPRIL-HYDROCHLOROTHIAZIDE 20-25 MG PO TABS: 1.0000 | ORAL_TABLET | Freq: Every day | ORAL | Status: DC

## 2015-01-25 MED ORDER — METFORMIN HCL 500 MG PO TABS: 500.0000 mg | ORAL_TABLET | Freq: Every day | ORAL | Status: DC

## 2015-02-18 ENCOUNTER — Other Ambulatory Visit: Payer: Self-pay | Admitting: Family Medicine

## 2015-04-12 ENCOUNTER — Encounter: Payer: Self-pay | Admitting: *Deleted

## 2015-04-26 ENCOUNTER — Ambulatory Visit (INDEPENDENT_AMBULATORY_CARE_PROVIDER_SITE_OTHER): Payer: BLUE CROSS/BLUE SHIELD | Admitting: Physician Assistant

## 2015-04-26 VITALS — BP 138/72 | HR 86 | Temp 98.7°F | Resp 16 | Ht 69.0 in | Wt 225.0 lb

## 2015-04-26 DIAGNOSIS — E119 Type 2 diabetes mellitus without complications: Secondary | ICD-10-CM

## 2015-04-26 DIAGNOSIS — I10 Essential (primary) hypertension: Secondary | ICD-10-CM | POA: Diagnosis not present

## 2015-04-26 DIAGNOSIS — E785 Hyperlipidemia, unspecified: Secondary | ICD-10-CM | POA: Diagnosis not present

## 2015-04-26 DIAGNOSIS — H6123 Impacted cerumen, bilateral: Secondary | ICD-10-CM

## 2015-04-26 LAB — COMPLETE METABOLIC PANEL WITH GFR
ALT: 20 U/L (ref 9–46)
AST: 18 U/L (ref 10–35)
Albumin: 4.1 g/dL (ref 3.6–5.1)
Alkaline Phosphatase: 60 U/L (ref 40–115)
BUN: 15 mg/dL (ref 7–25)
CHLORIDE: 99 mmol/L (ref 98–110)
CO2: 26 mmol/L (ref 20–31)
CREATININE: 1.01 mg/dL (ref 0.70–1.25)
Calcium: 8.9 mg/dL (ref 8.6–10.3)
GFR, Est Non African American: 78 mL/min (ref >=60)
GLUCOSE: 123 mg/dL — AB (ref 65–99)
Potassium: 4.3 mmol/L (ref 3.5–5.3)
SODIUM: 135 mmol/L (ref 135–146)
Total Bilirubin: 0.4 mg/dL (ref 0.2–1.2)
Total Protein: 7.1 g/dL (ref 6.1–8.1)

## 2015-04-26 LAB — LIPID PANEL
Cholesterol: 140 mg/dL (ref 125–200)
HDL: 54 mg/dL (ref >=40)
LDL CALC: 73 mg/dL (ref <130)
TRIGLYCERIDES: 66 mg/dL (ref <150)
Total CHOL/HDL Ratio: 2.6 Ratio (ref <=5.0)
VLDL: 13 mg/dL (ref <30)

## 2015-04-26 LAB — POCT GLYCOSYLATED HEMOGLOBIN (HGB A1C): HEMOGLOBIN A1C: 6.1

## 2015-04-26 MED ORDER — PRAVASTATIN SODIUM 40 MG PO TABS: ORAL_TABLET | ORAL | Status: DC

## 2015-04-26 MED ORDER — CARBAMIDE PEROXIDE 6.5 % OT SOLN: 5.0000 [drp] | Freq: Two times a day (BID) | OTIC | Status: AC

## 2015-04-26 MED ORDER — METOPROLOL SUCCINATE ER 100 MG PO TB24: 100.0000 mg | ORAL_TABLET | Freq: Every day | ORAL | Status: DC

## 2015-04-26 MED ORDER — LISINOPRIL-HYDROCHLOROTHIAZIDE 20-25 MG PO TABS: 1.0000 | ORAL_TABLET | Freq: Every day | ORAL | Status: DC

## 2015-04-26 MED ORDER — METFORMIN HCL 500 MG PO TABS: 500.0000 mg | ORAL_TABLET | Freq: Every day | ORAL | Status: DC

## 2015-04-26 NOTE — Patient Instructions (Signed)
Diabetes Mellitus and Food It is important for you to manage your blood sugar (glucose) level. Your blood glucose level can be greatly affected by what you eat. Eating healthier foods in the appropriate amounts throughout the day at about the same time each day will help you control your blood glucose level. It can also help slow or prevent worsening of your diabetes mellitus. Healthy eating may even help you improve the level of your blood pressure and reach or maintain a healthy weight.  HOW CAN FOOD AFFECT ME? Carbohydrates Carbohydrates affect your blood glucose level more than any other type of food. Your dietitian will help you determine how many carbohydrates to eat at each meal and teach you how to count carbohydrates. Counting carbohydrates is important to keep your blood glucose at a healthy level, especially if you are using insulin or taking certain medicines for diabetes mellitus. Alcohol Alcohol can cause sudden decreases in blood glucose (hypoglycemia), especially if you use insulin or take certain medicines for diabetes mellitus. Hypoglycemia can be a life-threatening condition. Symptoms of hypoglycemia (sleepiness, dizziness, and disorientation) are similar to symptoms of having too much alcohol.  If your health care provider has given you approval to drink alcohol, do so in moderation and use the following guidelines:  Women should not have more than one drink per day, and men should not have more than two drinks per day. One drink is equal to:  12 oz of beer.  5 oz of wine.  1 oz of hard liquor.  Do not drink on an empty stomach.  Keep yourself hydrated. Have water, diet soda, or unsweetened iced tea.  Regular soda, juice, and other mixers might contain a lot of carbohydrates and should be counted. WHAT FOODS ARE NOT RECOMMENDED? As you make food choices, it is important to remember that all foods are not the same. Some foods have fewer nutrients per serving than other  foods, even though they might have the same number of calories or carbohydrates. It is difficult to get your body what it needs when you eat foods with fewer nutrients. Examples of foods that you should avoid that are high in calories and carbohydrates but low in nutrients include:  Trans fats (most processed foods list trans fats on the Nutrition Facts label).  Regular soda.  Juice.  Candy.  Sweets, such as cake, pie, doughnuts, and cookies.  Fried foods. WHAT FOODS CAN I EAT? Have nutrient-rich foods, which will nourish your body and keep you healthy. The food you should eat also will depend on several factors, including:  The calories you need.  The medicines you take.  Your weight.  Your blood glucose level.  Your blood pressure level.  Your cholesterol level. You also should eat a variety of foods, including:  Protein, such as meat, poultry, fish, tofu, nuts, and seeds (lean animal proteins are best).  Fruits.  Vegetables.  Dairy products, such as milk, cheese, and yogurt (low fat is best).  Breads, grains, pasta, cereal, rice, and beans.  Fats such as olive oil, trans fat-free margarine, canola oil, avocado, and olives. DOES EVERYONE WITH DIABETES MELLITUS HAVE THE SAME MEAL PLAN? Because every person with diabetes mellitus is different, there is not one meal plan that works for everyone. It is very important that you meet with a dietitian who will help you create a meal plan that is just right for you. Document Released: 05/18/2005 Document Revised: 08/26/2013 Document Reviewed: 07/18/2013 Exit[employer] Patient Information 2015 [employer]. This   information is not intended to replace advice given to you by your health care provider. Make sure you discuss any questions you have with your health care provider.  

## 2015-04-26 NOTE — Progress Notes (Signed)
[facility] [address] [place] [address] [phone]  Date:  04/26/2015   Name:  Chad Simon   DOB:  1950/10/18   MRN:  [id number]  PCP:  Wendie Agreste, MD    History of Present Illness:  Chad Simon is a 64 y.o. male patient who presents to [facility] for medication refill for anti-hypertensives, diabetes, and HL meds.  He reports no complaints or conerns at this time.  No adverse side effects with meds.  No cp, palpitiatons, sob, dizziness, leg swelling, or abnormal shortness of breath. He is restricting his diet, but has been attempting to eat fresh fruits and vegetables. He is not exercising at this time.  Works Chief Executive Officer at work.   No numbness or tingling.   No vision changes, polyuria, n/v, or dysuria.    Also would like ears checked.  Feels as if his hearing is reduced.  No dysequilibrium, drainage, or tinnitus.  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  Wears ear plugs, but may not, due to the discomfort of wearing them.  Patient Active Problem List   Diagnosis Date Noted  . DM2 (diabetes mellitus, type 2) 02/03/2013  . HTN (hypertension) 07/29/2012  . Hyperlipidemia 07/29/2012    Past Medical History  Diagnosis Date  . Hypertension   . Tachycardia   . Hyperlipidemia   . Diabetes mellitus without complication     Past Surgical History  Procedure Laterality Date  . Appendectomy  1971    Social History  Substance Use Topics  . Smoking status: Never Smoker   . Smokeless tobacco: None  . Alcohol Use: No    Family History  Problem Relation Age of Onset  . Heart disease Mother   . Hypertension Mother   . Stroke Father   . Hypertension Father   . Hypertension Sister   . Hypertension Brother   . Hypertension Maternal Grandmother   . Hypertension Maternal Grandfather   . Hypertension Paternal Grandmother   . Hypertension Paternal Grandfather     No Known Allergies  Medication list has been reviewed and updated.  Current  Outpatient Prescriptions on File Prior to Visit  Medication Sig Dispense Refill  . lisinopril-hydrochlorothiazide (PRINZIDE,ZESTORETIC) 20-25 MG per tablet TAKE 1 TABLET BY MOUTH DAILY. PATIENT NEEDS OFFICE VISIT FOR ADDITIONAL REFILLS 15 tablet 0  . metFORMIN (GLUCOPHAGE) 500 MG tablet TAKE 1 TABLE BY MOUTH DAILY. PATIENT NEEDS OFFICE VISIT FOR ADDITIONAL REFILLS 15 tablet 0  . metoprolol succinate (TOPROL-XL) 100 MG 24 hr tablet TAKE 1 TABLET (100 MG TOTAL) BY MOUTH DAILY. PATIENT NEEDS OFFICE VISIT FOR ADDITIONAL REFILLS 15 tablet 0  . pravastatin (PRAVACHOL) 40 MG tablet TAKE 1 TABLET BY MOUTH DAILY 90 tablet 1  . aspirin 81 MG tablet Take 81 mg by mouth daily.    . colchicine 0.6 MG tablet Take 1 tablet (0.6 mg total) by mouth 2 (two) times daily. (Patient not taking: Reported on 10/26/2014) 15 tablet 0  . HYDROcodone-acetaminophen (NORCO/VICODIN) 5-325 MG per tablet Take 1 tablet by mouth every 6 (six) hours as needed for moderate pain. (Patient not taking: Reported on 10/26/2014) 15 tablet 0  . loperamide (IMODIUM A-D) 2 MG tablet Take 1 tablet (2 mg total) by mouth 4 (four) times daily as needed for diarrhea or loose stools. (Patient not taking: Reported on 04/26/2015) 30 tablet 0  . ofloxacin (FLOXIN) 0.3 % otic solution Place 10 drops into the right ear daily. (Patient not taking: Reported on 10/26/2014) 5 mL  0  . ondansetron (ZOFRAN) 4 MG tablet Take 1 tablet (4 mg total) by mouth every 8 (eight) hours as needed for nausea or vomiting. (Patient not taking: Reported on 04/26/2015) 20 tablet 0   No current facility-administered medications on file prior to visit.    ROS ROS otherwise unremarkable unless listed above.   Physical Examination: BP 138/72 mmHg  Pulse 86  Temp(Src) 98.7 F (37.1 C) (Oral)  Resp 16  Ht 5\' 9"  (1.753 m)  Wt 225 lb (102.059 kg)  BMI 33.21 kg/m2  SpO2 98% Ideal Body Weight: Weight in (lb) to have BMI = 25: 168.9  Physical Exam  Constitutional: He is oriented  to person, place, and time. He appears well-developed and well-nourished. No distress.  HENT:  TMs unable to visualize due to cerumen impaction. Left TM visualized and normal, after ample cerumen irrigated.  Right TM still with heavy cerumen impaction.   Eyes: Conjunctivae are normal. Pupils are equal, round, and reactive to light. No scleral icterus.  Cardiovascular: Normal rate.  Exam reveals no gallop and no friction rub.   Murmur (grade 1 systolic) heard. Pulses:      Dorsalis pedis pulses are 2+ on the right side, and 2+ on the left side.       Posterior tibial pulses are 2+ on the right side, and 2+ on the left side.  Pulmonary/Chest: Breath sounds normal. No respiratory distress. He has no wheezes.  Musculoskeletal: He exhibits no edema.       Right foot: There is normal range of motion.       Left foot: There is normal range of motion.  Feet:  Right Foot:  Protective Sensation: 10 sites tested.10 sites sensed. Skin Integrity: Negative for ulcer, skin breakdown, erythema or warmth.  Left Foot:  Protective Sensation: 10 sites tested. 10 sites sensed. Skin Integrity: Negative for ulcer, skin breakdown, erythema or warmth.  Neurological: He is alert and oriented to person, place, and time.  Skin: Skin is warm and dry. He is not diaphoretic.  Psychiatric: He has a normal mood and affect. His behavior is normal.     Assessment and Plan: 64 year old male is here today for medication refill. -a1c slightly elevated, though normal at this time.  Diabetic diet restrictions given.   -Advised to try to get some exercise within the week. -Medications refilled, with labs updated. -Debrox given, advised not to exceed 4 days.   Diabetes type 2, controlled - Plan: POCT glycosylated hemoglobin (Hb A1C), COMPLETE METABOLIC PANEL WITH GFR, metFORMIN (GLUCOPHAGE) 500 MG tablet  Hyperlipidemia - Plan: Lipid panel, pravastatin (PRAVACHOL) 40 MG tablet  Essential hypertension - Plan: POCT  glycosylated hemoglobin (Hb A1C), COMPLETE METABOLIC PANEL WITH GFR, Lipid panel, lisinopril-hydrochlorothiazide (PRINZIDE,ZESTORETIC) 20-25 MG per tablet, metoprolol succinate (TOPROL-XL) 100 MG 24 hr tablet  Cerumen impaction, bilateral - Plan: carbamide peroxide (DEBROX) 6.5 % otic solution    Ivar Drape, PA-C [facility] 04/26/2015 8:32 AM

## 2015-06-27 ENCOUNTER — Encounter: Payer: Self-pay | Admitting: Physician Assistant

## 2015-06-27 DIAGNOSIS — H269 Unspecified cataract: Secondary | ICD-10-CM | POA: Insufficient documentation

## 2015-06-27 DIAGNOSIS — E11319 Type 2 diabetes mellitus with unspecified diabetic retinopathy without macular edema: Secondary | ICD-10-CM | POA: Insufficient documentation

## 2015-09-09 IMAGING — CR DG ANKLE COMPLETE 3+V*R*
4 series · 4 of 4 positions shown · non-contrast
Comparison: None

CLINICAL DATA: Right ankle pain and swelling

EXAM:
RIGHT ANKLE - COMPLETE 3+ VIEW

[AP]
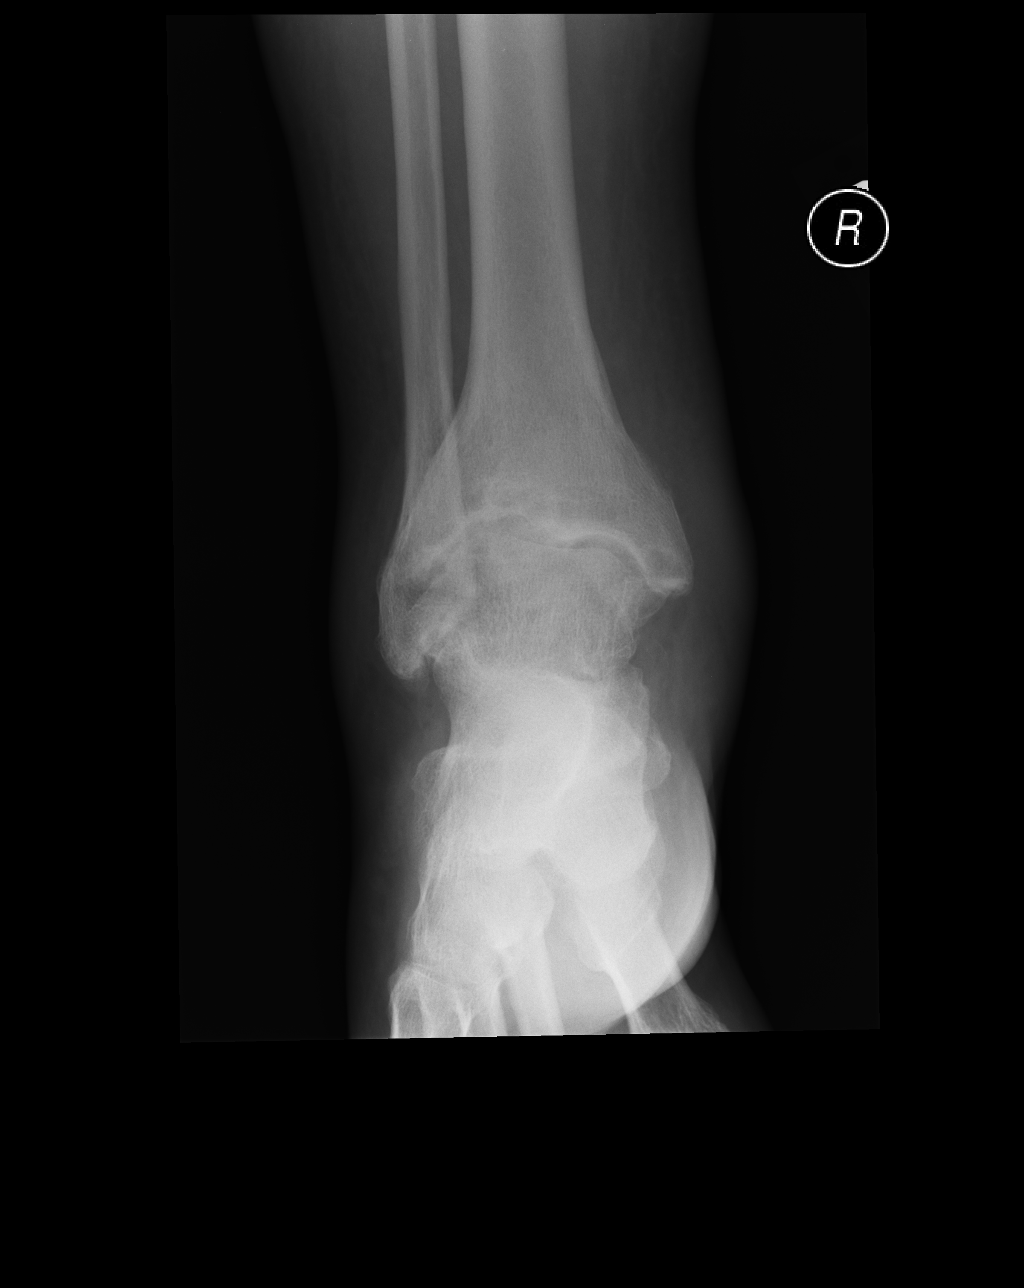

[ap obl int rot (1 of 2)]
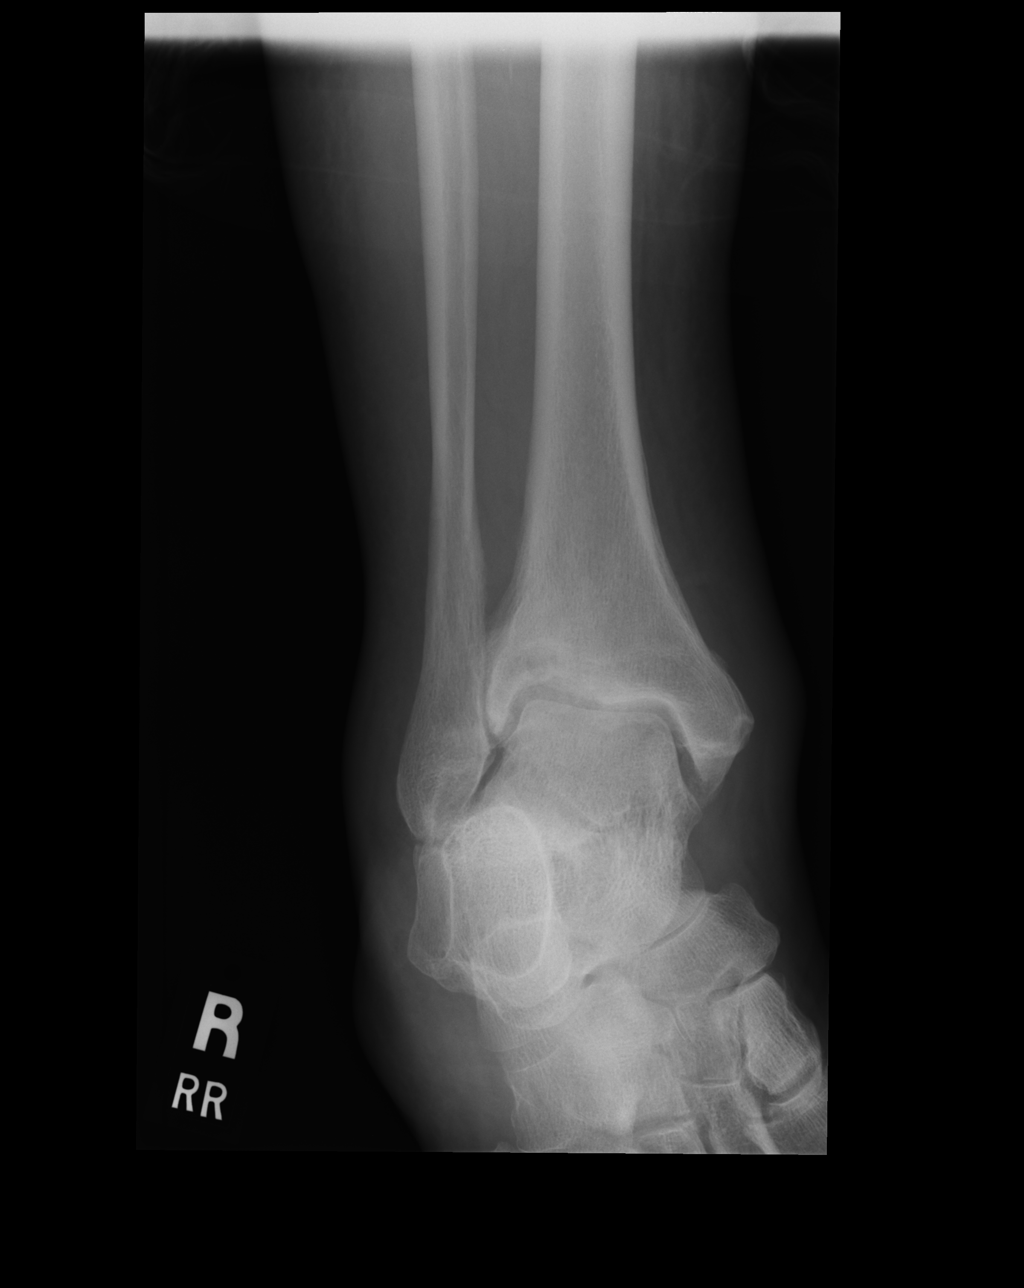

[ap obl int rot (2 of 2)]
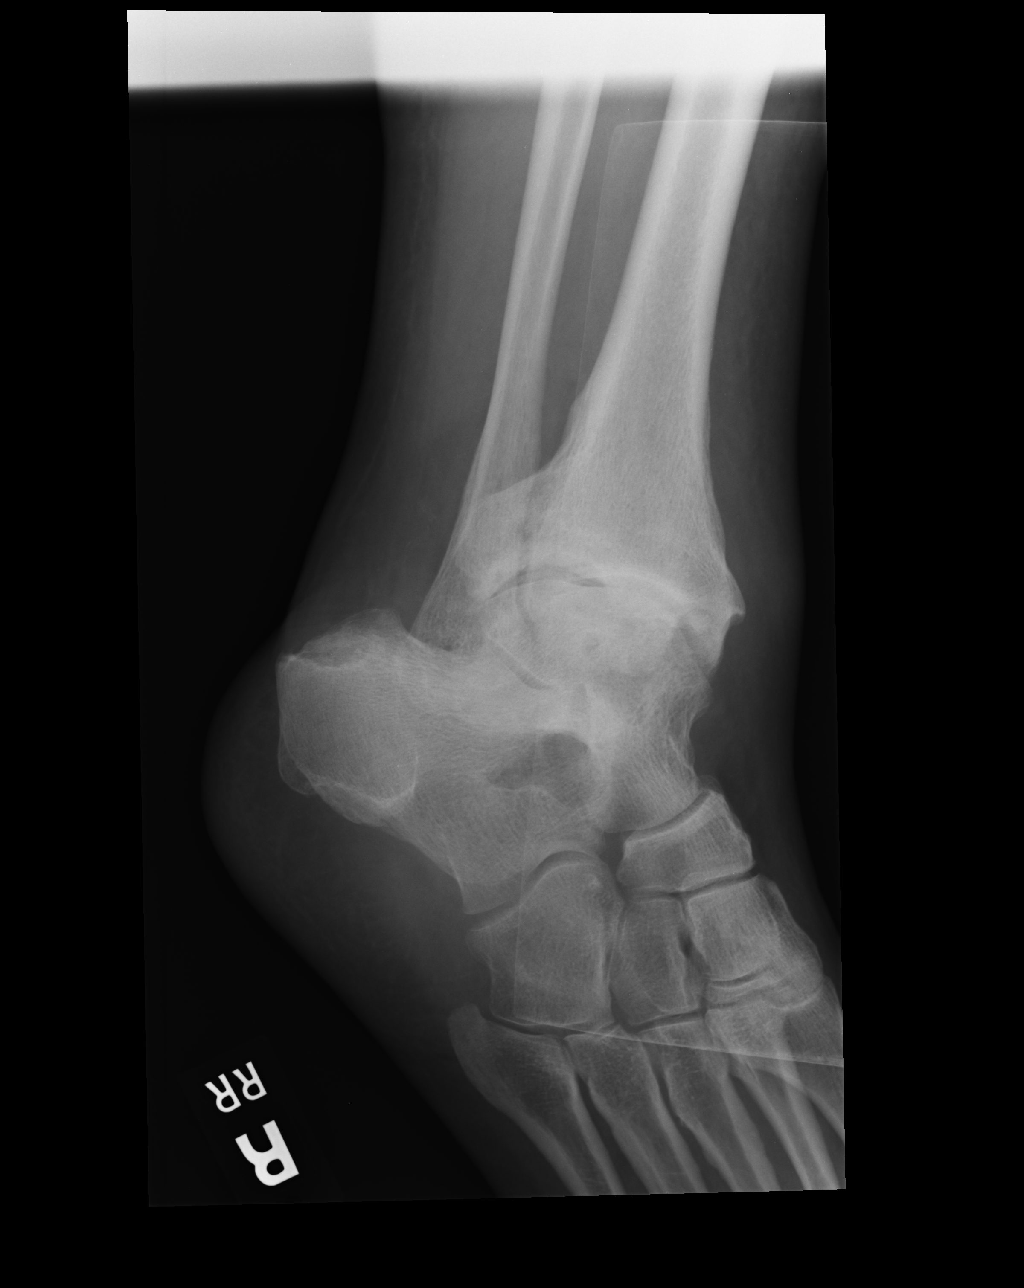

[lateral]
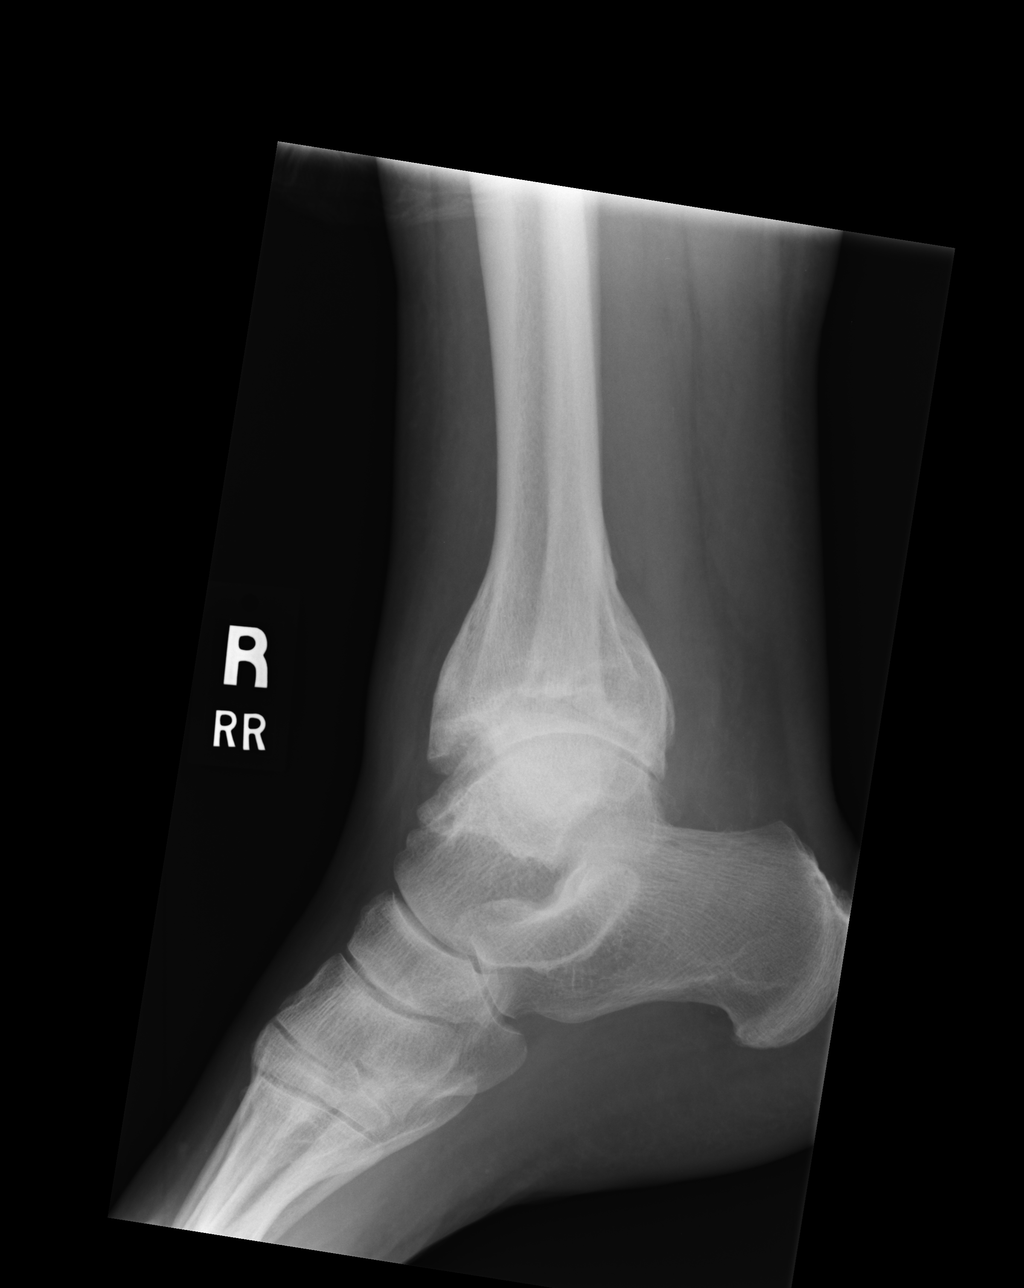

[4 of 4 positions shown; findings below may reference images not displayed]

FINDINGS: Extensive soft tissue swelling about the ankle, predominantly along
the medial aspect. No acute fracture or malalignment is present.
There is secondary osteoarthritis of the tibiotalar joint with
irregularity, sclerosis and subchondral cyst formation throughout
the distal tibia. Residual posttraumatic deformity of the distal
tibia consistent with remote healed trauma. The tailor dome remains
intact. The visualized bones and joints the foot are unremarkable.
IMPRESSION: 1. Diffuse soft tissue swelling about the ankle, particularly
medially.
2. No acute fracture or malalignment.
3. Degenerative osteoarthritis of the tibiotalar joint likely
secondary to remote and now healed fracture of the distal tibia.

## 2016-01-22 ENCOUNTER — Other Ambulatory Visit: Payer: Self-pay | Admitting: Physician Assistant

## 2016-07-25 ENCOUNTER — Other Ambulatory Visit: Payer: Self-pay | Admitting: Physician Assistant

## 2016-07-29 NOTE — Telephone Encounter (Signed)
04/2015 last ov and labs No answer on pts #. Needs ov, noted on rx.

## 2020-01-06 ENCOUNTER — Ambulatory Visit: Payer: Self-pay | Attending: Internal Medicine

## 2020-01-06 DIAGNOSIS — Z23 Encounter for immunization: Secondary | ICD-10-CM

## 2020-01-06 NOTE — Progress Notes (Signed)
   Covid-19 Vaccination Clinic  Name:  Chad Simon    MRN: [id number] DOB: 01/16/1951  01/06/2020  [name] was observed post Covid-19 immunization for 15 minutes without incident. He was provided with Vaccine Information Sheet and instruction to access the V-Safe system.   [name] was instructed to call 911 with any severe reactions post vaccine: Marland Kitchen Difficulty breathing  . Swelling of face and throat  . A fast heartbeat  . A bad rash all over body  . Dizziness and weakness   Immunizations Administered    Name Date Dose VIS Date Route   Pfizer COVID-19 Vaccine 01/06/2020 10:32 AM 0.3 mL 10/29/2018 Intramuscular   Manufacturer: [place]   Lot: [id number]   [place]: [id number]

## 2020-02-03 ENCOUNTER — Ambulatory Visit: Payer: Self-pay | Attending: Internal Medicine

## 2020-02-03 DIAGNOSIS — Z23 Encounter for immunization: Secondary | ICD-10-CM

## 2020-02-03 NOTE — Progress Notes (Signed)
   Covid-19 Vaccination Clinic  Name:  Chad Simon    MRN: [id number] DOB: Sep 30, 1950  02/03/2020  [name] was observed post Covid-19 immunization for 15 minutes without incident. He was provided with Vaccine Information Sheet and instruction to access the V-Safe system.   [name] was instructed to call 911 with any severe reactions post vaccine: Marland Kitchen Difficulty breathing  . Swelling of face and throat  . A fast heartbeat  . A bad rash all over body  . Dizziness and weakness   Immunizations Administered    Name Date Dose VIS Date Route   Pfizer COVID-19 Vaccine 02/03/2020  1:01 PM 0.3 mL 10/29/2018 Intramuscular   Manufacturer: [employer], [employer]   Lot: [id number]   [place]: [id number]
